# Patient Record
Sex: Male | Born: 1965 | Race: Black or African American | Hispanic: No | Smoking: Current every day smoker
Health system: Southern US, Community
[De-identification: ages and names within clinical notes are randomized; demographics above are authoritative.]

## PROBLEM LIST (undated history)

## (undated) DIAGNOSIS — I10 Essential (primary) hypertension: Secondary | ICD-10-CM

## (undated) DIAGNOSIS — R7301 Impaired fasting glucose: Secondary | ICD-10-CM

## (undated) DIAGNOSIS — I509 Heart failure, unspecified: Secondary | ICD-10-CM

## (undated) DIAGNOSIS — E7800 Pure hypercholesterolemia, unspecified: Secondary | ICD-10-CM

## (undated) HISTORY — DX: Essential (primary) hypertension: I10

## (undated) HISTORY — DX: Heart failure, unspecified: I50.9

## (undated) HISTORY — DX: Pure hypercholesterolemia, unspecified: E78.00

## (undated) HISTORY — DX: Impaired fasting glucose: R73.01

---

## 2009-12-11 ENCOUNTER — Emergency Department (HOSPITAL_COMMUNITY): Admission: EM | Admit: 2009-12-11 | Discharge: 2009-12-11 | Payer: Self-pay | Admitting: Family Medicine

## 2009-12-21 ENCOUNTER — Encounter: Admission: RE | Admit: 2009-12-21 | Discharge: 2009-12-21 | Payer: Self-pay | Admitting: Family Medicine

## 2010-04-30 ENCOUNTER — Inpatient Hospital Stay (HOSPITAL_COMMUNITY)
Admission: EM | Admit: 2010-04-30 | Discharge: 2010-05-03 | Payer: Self-pay | Source: Home / Self Care | Attending: Internal Medicine | Admitting: Internal Medicine

## 2010-08-07 LAB — COMPREHENSIVE METABOLIC PANEL
ALT: 21 U/L (ref 0–53)
AST: 25 U/L (ref 0–37)
AST: 25 U/L (ref 0–37)
Albumin: 3.2 g/dL — ABNORMAL LOW (ref 3.5–5.2)
Alkaline Phosphatase: 50 U/L (ref 39–117)
CO2: 26 mEq/L (ref 19–32)
Calcium: 8.4 mg/dL (ref 8.4–10.5)
Calcium: 8.8 mg/dL (ref 8.4–10.5)
Creatinine, Ser: 1.27 mg/dL (ref 0.4–1.5)
GFR calc Af Amer: >60 mL/min (ref >60)
GFR calc Af Amer: >60 mL/min (ref >60)
GFR calc non Af Amer: >60 mL/min (ref >60)
Glucose, Bld: 110 mg/dL — ABNORMAL HIGH (ref 70–99)
Glucose, Bld: 117 mg/dL — ABNORMAL HIGH (ref 70–99)
Potassium: 3.6 mEq/L (ref 3.5–5.1)
Sodium: 141 mEq/L (ref 135–145)
Total Protein: 6.7 g/dL (ref 6.0–8.3)

## 2010-08-07 LAB — TSH: TSH: 2.67 u[IU]/mL (ref 0.350–4.500)

## 2010-08-07 LAB — BRAIN NATRIURETIC PEPTIDE
Pro B Natriuretic peptide (BNP): 327 pg/mL — ABNORMAL HIGH (ref 0.0–100.0)
Pro B Natriuretic peptide (BNP): 94 pg/mL (ref 0.0–100.0)

## 2010-08-07 LAB — CBC
HCT: 43.2 % (ref 39.0–52.0)
Hemoglobin: 13.4 g/dL (ref 13.0–17.0)
MCH: 28.7 pg (ref 26.0–34.0)
MCHC: 32.6 g/dL (ref 30.0–36.0)
MCHC: 32.9 g/dL (ref 30.0–36.0)
Platelets: 149 10*3/uL — ABNORMAL LOW (ref 150–400)
RDW: 14.6 % (ref 11.5–15.5)
WBC: 4.8 10*3/uL (ref 4.0–10.5)

## 2010-08-07 LAB — DIFFERENTIAL
Lymphocytes Relative: 32 % (ref 12–46)
Lymphs Abs: 2 10*3/uL (ref 0.7–4.0)
Neutro Abs: 3.4 10*3/uL (ref 1.7–7.7)
Neutrophils Relative %: 56 % (ref 43–77)

## 2010-08-07 LAB — URINALYSIS, ROUTINE W REFLEX MICROSCOPIC
Bilirubin Urine: NEGATIVE
Hgb urine dipstick: NEGATIVE
Ketones, ur: NEGATIVE mg/dL
Nitrite: NEGATIVE
Urobilinogen, UA: 0.2 mg/dL (ref 0.0–1.0)

## 2010-08-07 LAB — LIPID PANEL
HDL: 35 mg/dL — ABNORMAL LOW (ref >39)
Total CHOL/HDL Ratio: 4.9 RATIO
VLDL: 25 mg/dL (ref 0–40)

## 2010-08-07 LAB — POCT CARDIAC MARKERS
CKMB, poc: 1.9 ng/mL (ref 1.0–8.0)
Myoglobin, poc: 89.3 ng/mL (ref 12–200)
Troponin i, poc: <0.05 ng/mL (ref 0.00–0.09)

## 2010-08-07 LAB — CK: Total CK: 330 U/L — ABNORMAL HIGH (ref 7–232)

## 2010-08-07 LAB — BASIC METABOLIC PANEL
Calcium: 8.8 mg/dL (ref 8.4–10.5)
GFR calc Af Amer: >60 mL/min (ref >60)
GFR calc non Af Amer: >60 mL/min (ref >60)
Potassium: 4.1 mEq/L (ref 3.5–5.1)
Sodium: 140 mEq/L (ref 135–145)

## 2010-08-07 LAB — CARDIAC PANEL(CRET KIN+CKTOT+MB+TROPI)
CK, MB: 3.2 ng/mL (ref 0.3–4.0)
Relative Index: 1.3 (ref 0.0–2.5)
Troponin I: 0.12 ng/mL — ABNORMAL HIGH (ref 0.00–0.06)
Troponin I: 0.13 ng/mL — ABNORMAL HIGH (ref 0.00–0.06)

## 2010-08-07 LAB — RAPID URINE DRUG SCREEN, HOSP PERFORMED
Amphetamines: NOT DETECTED
Cocaine: NOT DETECTED
Tetrahydrocannabinol: NOT DETECTED

## 2010-08-07 LAB — MRSA PCR SCREENING: MRSA by PCR: POSITIVE — AB

## 2010-08-07 LAB — HEMOGLOBIN A1C: Hgb A1c MFr Bld: 6.3 % — ABNORMAL HIGH (ref <5.7)

## 2010-08-07 LAB — CK TOTAL AND CKMB (NOT AT ARMC): Relative Index: 1.2 (ref 0.0–2.5)

## 2010-08-07 LAB — TROPONIN I: Troponin I: 0.13 ng/mL — ABNORMAL HIGH (ref 0.00–0.06)

## 2010-12-05 ENCOUNTER — Inpatient Hospital Stay (INDEPENDENT_AMBULATORY_CARE_PROVIDER_SITE_OTHER)
Admission: RE | Admit: 2010-12-05 | Discharge: 2010-12-05 | Disposition: A | Discharge status: Home / Self Care | Payer: 59 | Source: Ambulatory Visit | Attending: Family Medicine | Admitting: Family Medicine

## 2010-12-05 DIAGNOSIS — M79609 Pain in unspecified limb: Secondary | ICD-10-CM

## 2010-12-05 DIAGNOSIS — A64 Unspecified sexually transmitted disease: Secondary | ICD-10-CM

## 2011-11-27 ENCOUNTER — Inpatient Hospital Stay: Payer: Self-pay | Admitting: Internal Medicine

## 2011-11-27 LAB — COMPREHENSIVE METABOLIC PANEL
Albumin: 3.5 g/dL (ref 3.4–5.0)
Alkaline Phosphatase: 77 U/L (ref 50–136)
Anion Gap: 5 — ABNORMAL LOW (ref 7–16)
BUN: 20 mg/dL — ABNORMAL HIGH (ref 7–18)
Bilirubin,Total: 0.5 mg/dL (ref 0.2–1.0)
Glucose: 105 mg/dL — ABNORMAL HIGH (ref 65–99)
Osmolality: 282 (ref 275–301)
Potassium: 3.7 mmol/L (ref 3.5–5.1)
SGOT(AST): 38 U/L — ABNORMAL HIGH (ref 15–37)
Sodium: 140 mmol/L (ref 136–145)

## 2011-11-27 LAB — CBC
MCH: 30 pg (ref 26.0–34.0)
MCHC: 32.3 g/dL (ref 32.0–36.0)
Platelet: 125 10*3/uL — ABNORMAL LOW (ref 150–440)
RDW: 16.2 % — ABNORMAL HIGH (ref 11.5–14.5)

## 2011-11-27 LAB — TROPONIN I
Troponin-I: 1.31 ng/mL — ABNORMAL HIGH
Troponin-I: 1.26 ng/mL — ABNORMAL HIGH
Troponin-I: 1.11 ng/mL — ABNORMAL HIGH

## 2011-11-27 LAB — APTT
Activated PTT: 28.3 secs (ref 23.6–35.9)
Activated PTT: 149.3 secs — ABNORMAL HIGH (ref 23.6–35.9)

## 2011-11-27 LAB — URINALYSIS, COMPLETE
Bacteria: NONE SEEN
Ketone: NEGATIVE
Protein: NEGATIVE
Specific Gravity: 1.004 (ref 1.003–1.030)
Squamous Epithelial: <1
WBC UR: <1 /HPF (ref 0–5)

## 2011-11-28 LAB — APTT: Activated PTT: >160 secs (ref 23.6–35.9)

## 2012-07-12 IMAGING — CR DG CHEST 2V
2 series · 2 of 2 positions shown · non-contrast
Comparison: None.

CLINICAL DATA: Shortness of breath.  Bilateral leg edema.

CHEST - 2 VIEW

[w chest pa]
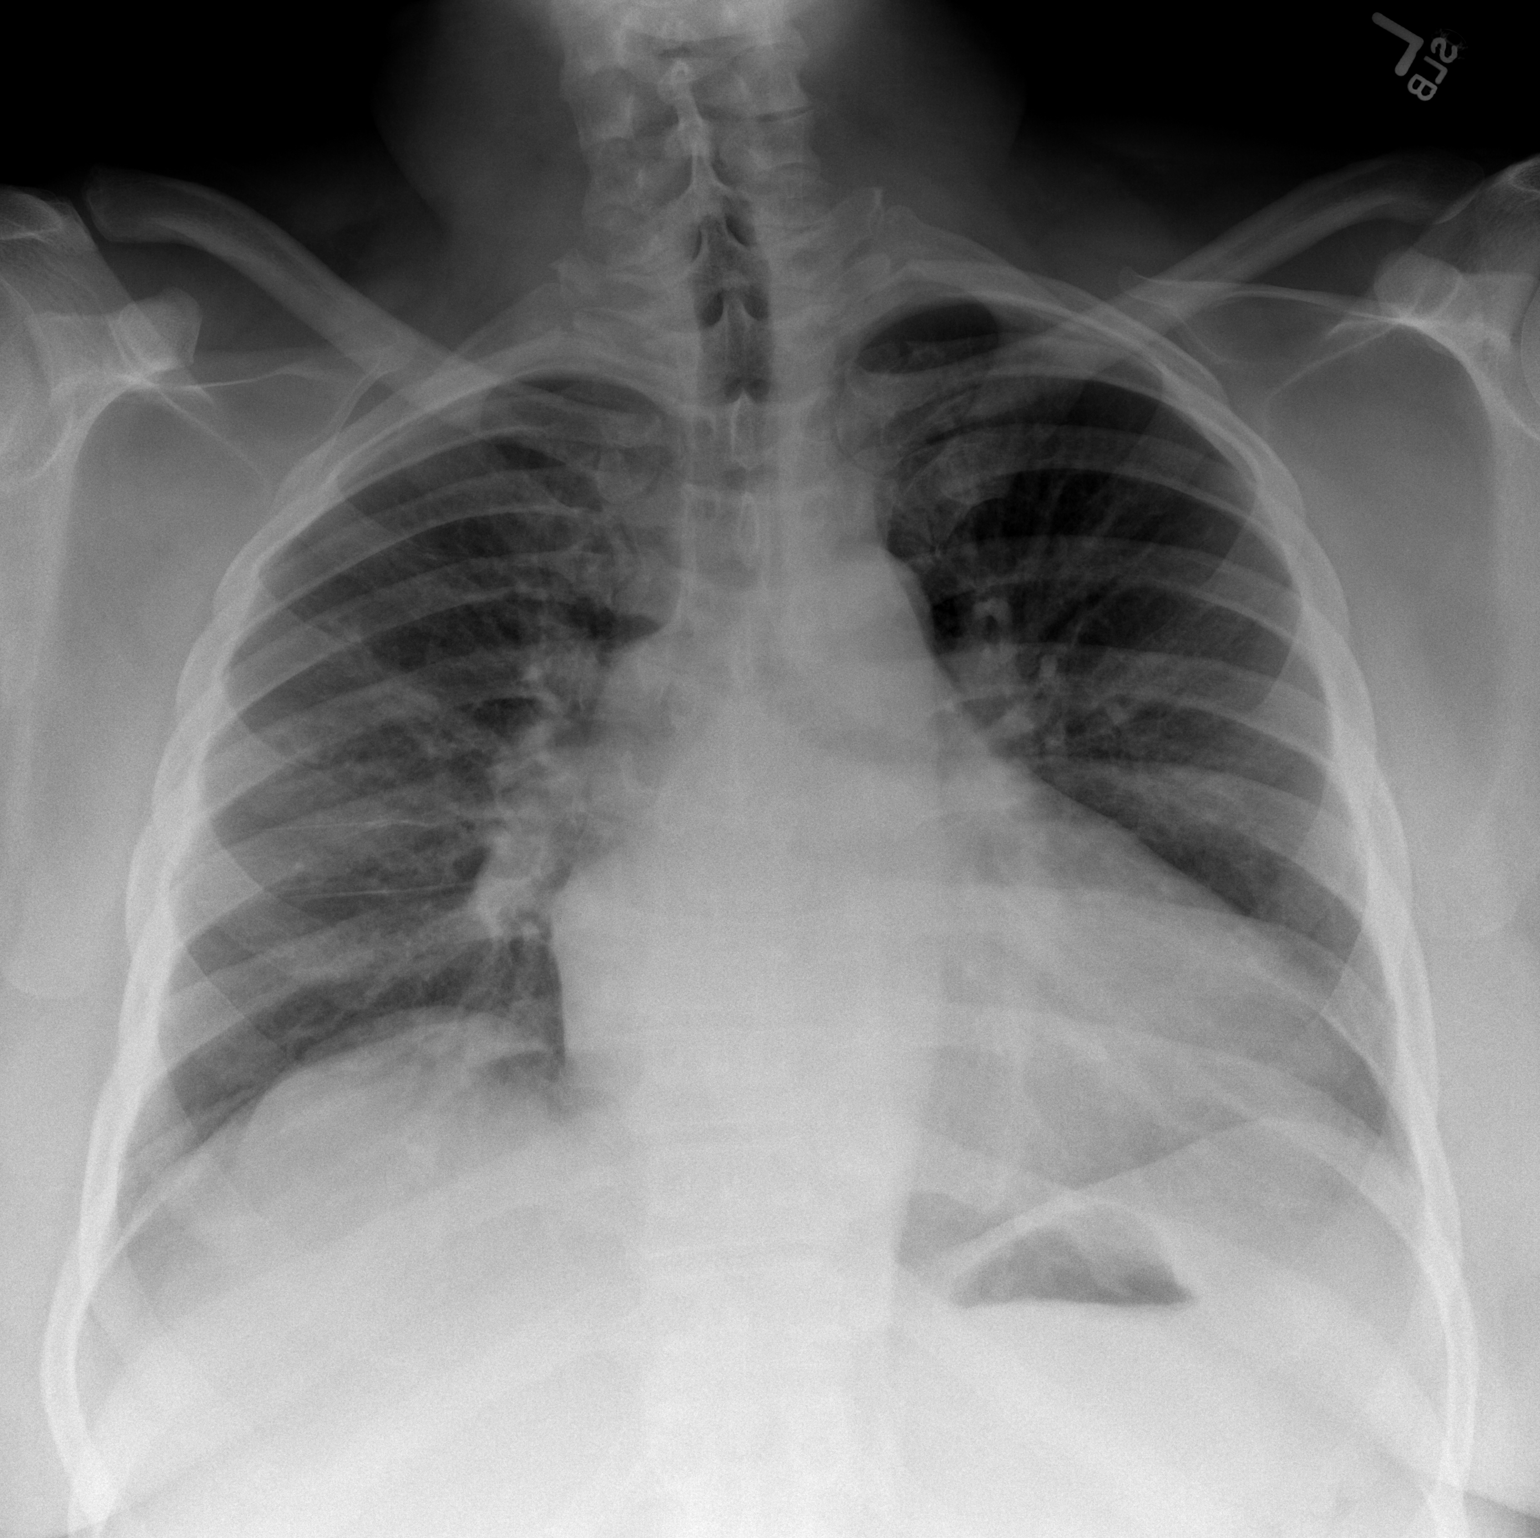

[w chest lat *]
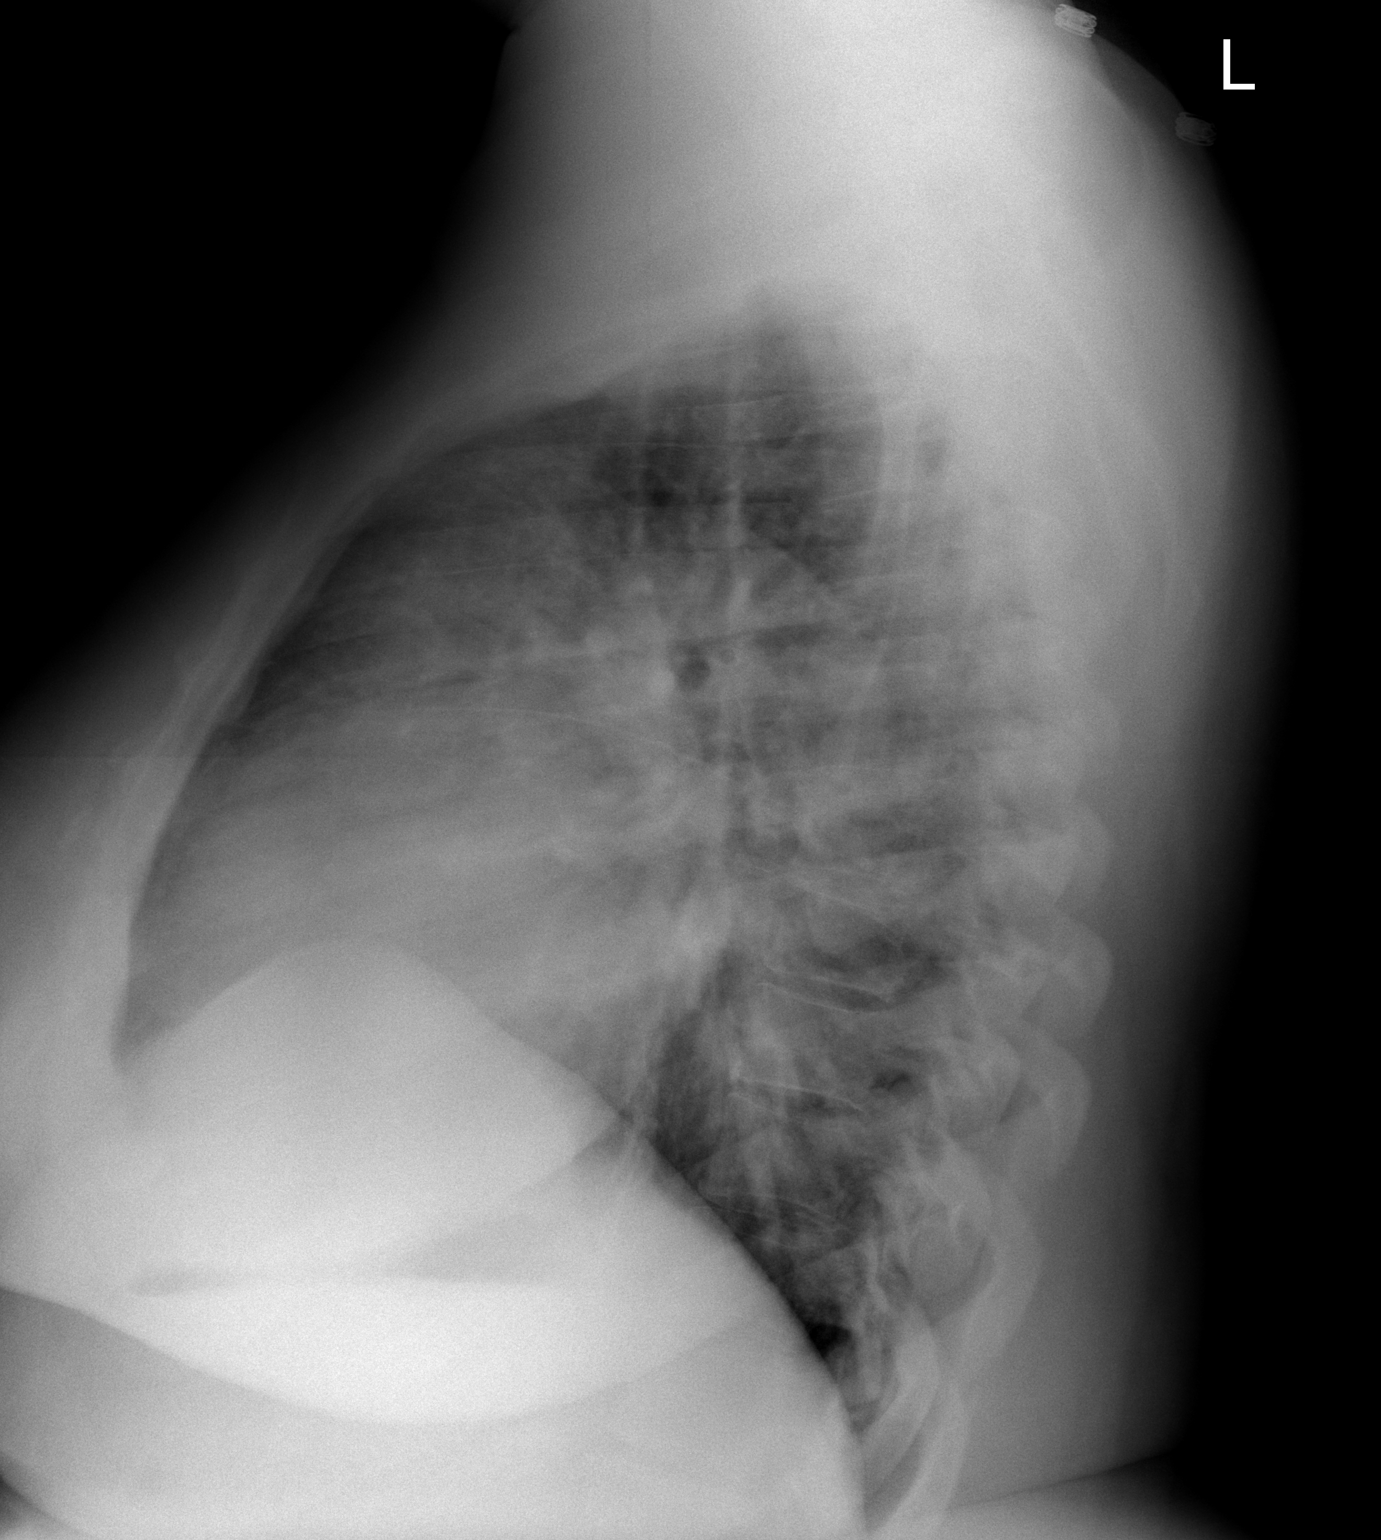

[2 of 2 positions shown; findings below may reference images not displayed]

FINDINGS: Mild cardiomegaly noted.

Low lung volumes are present, causing crowding of the pulmonary
vasculature.

On the lateral projection, there is density projecting over the
anterior mediastinum which could possibly represent a mediastinal
mass.  Accordingly, CT of chest with contrast is recommended.

No pleural effusion identified.
IMPRESSION: 1.  Greater than expected density projecting over the anterior
mediastinum on the lateral projection.  I cannot confidently
exclude an anterior mediastinal mass, and accordingly, CT the chest
with contrast is recommended.
2.  Mild cardiomegaly.
3.  Low lung volumes.

## 2012-07-13 IMAGING — CT CT CHEST W/ CM
2 of 4 series · 15 of 36 positions shown, 18 images · IV contrast (APPLIED)
Comparison: Chest radiograph dated 04/30/2010

CLINICAL DATA: Chest pain.  Congestive heart failure.  Leg
swelling.  Dyspnea.

CT CHEST WITH CONTRAST
TECHNIQUE: Multidetector CT imaging of the chest was performed
following the standard protocol during bolus administration of
intravenous contrast.
Contrast: 100 ml Bmnipaque-CUU

[Series 2: routine chest 5.0 st · axial · 0.85mm/px · z∈[+986,+1270]mm · 12 of 67 slices shown, 15 images]
[im 5/67  mediastinal]
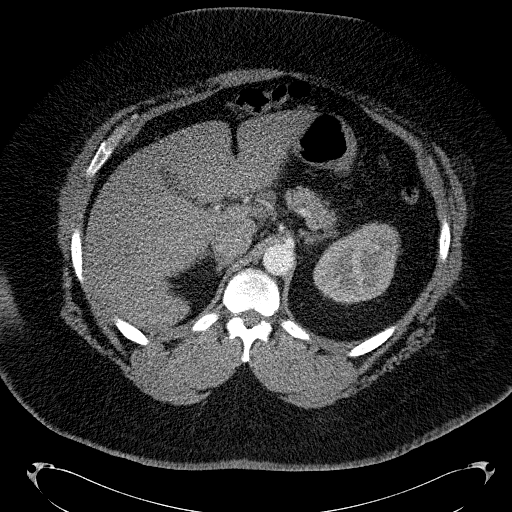
[im 5/67  lung]
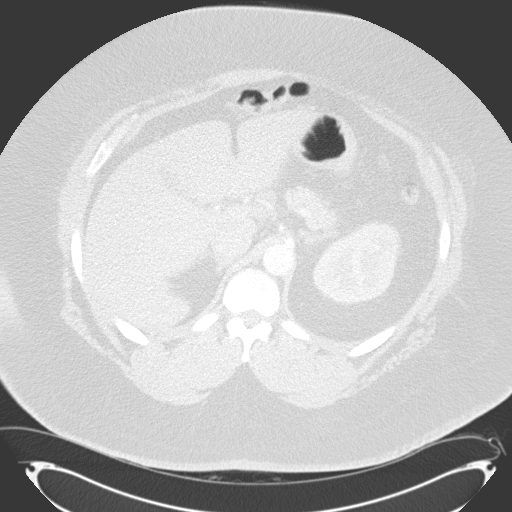
[im 10/67  lung]
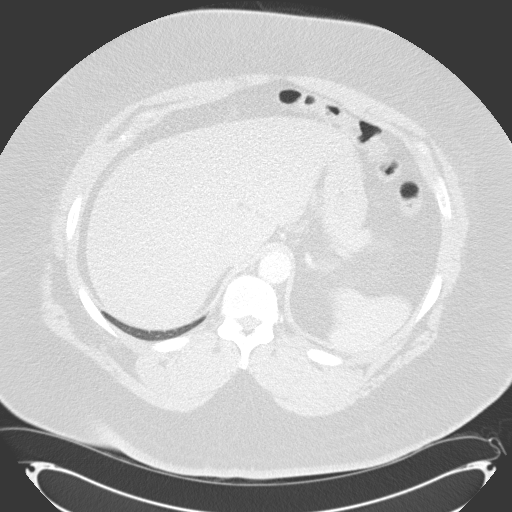
[im 15/67  lung]
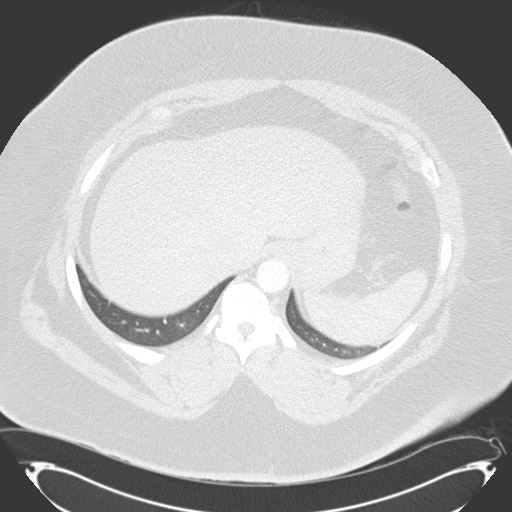
[im 19/67  lung]
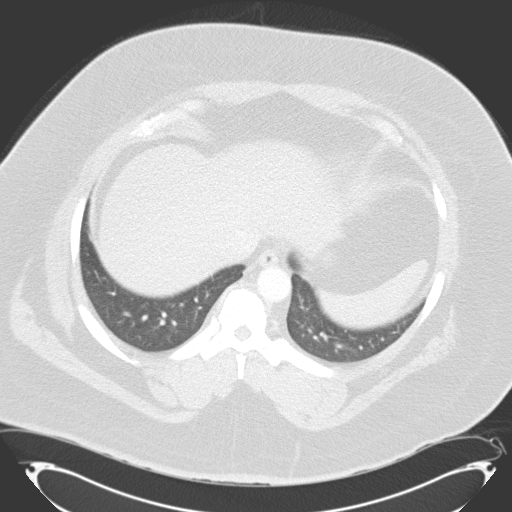
[im 24/67  mediastinal]
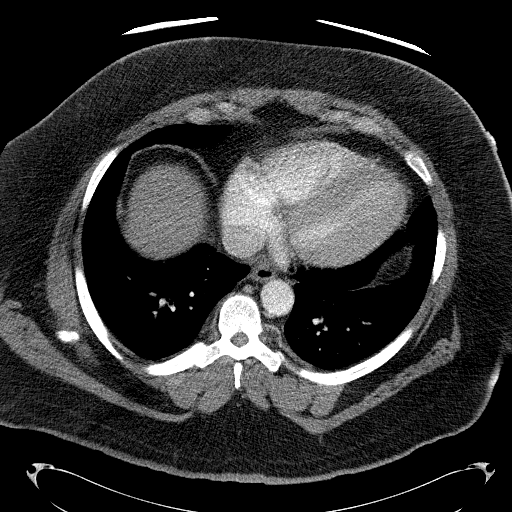
[im 24/67  lung]
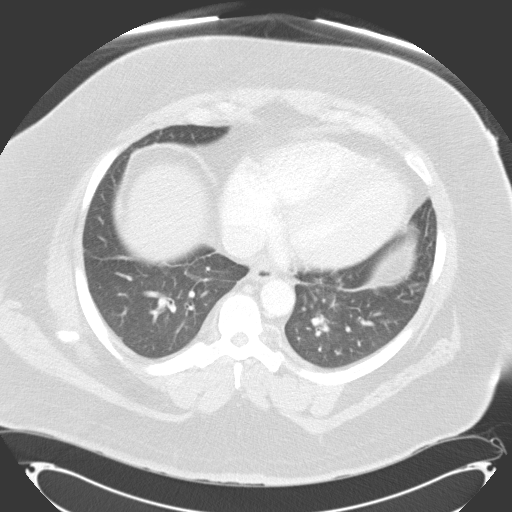
[im 29/67  lung]
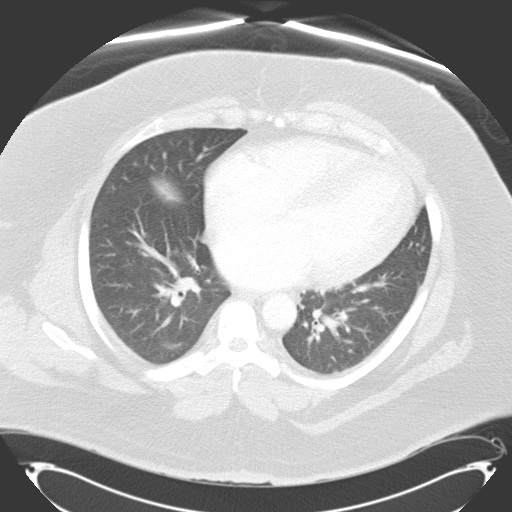
[im 38/67  lung]
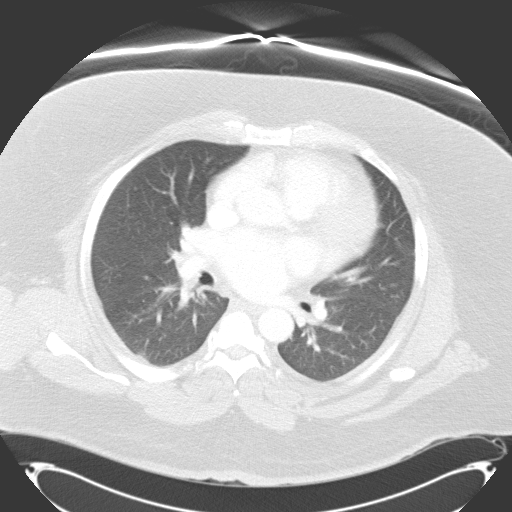
[im 43/67  lung]
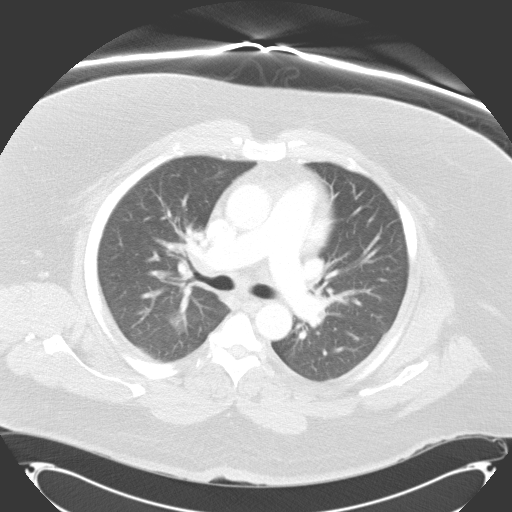
[im 48/67  mediastinal]
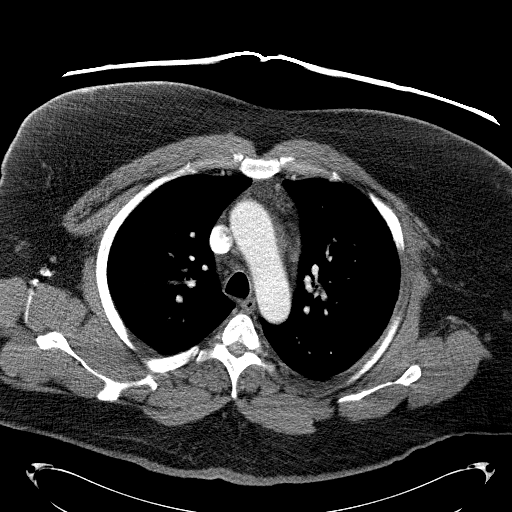
[im 48/67  lung]
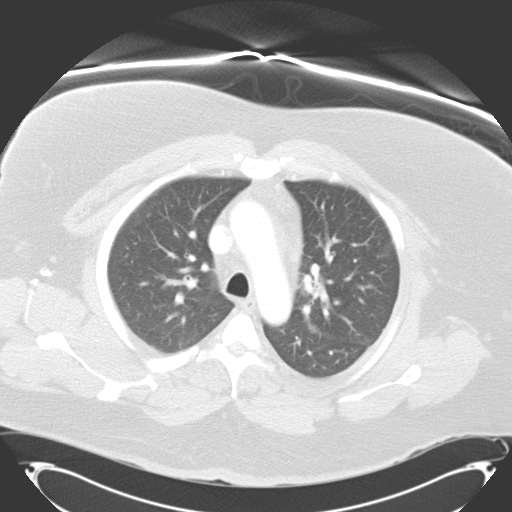
[im 52/67  lung]
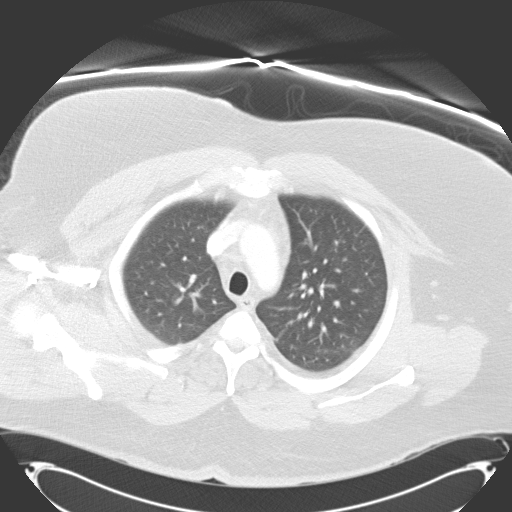
[im 57/67  lung]
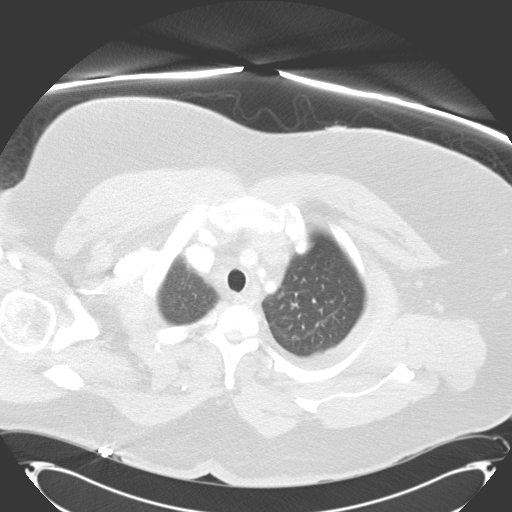
[im 62/67  lung]
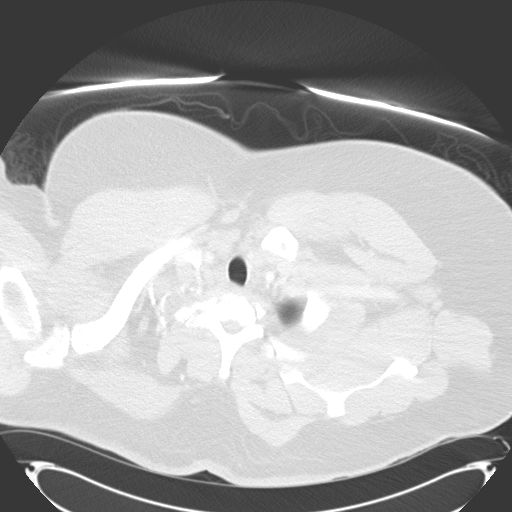

[Series 5: routine chest 2.0 st · coronal · 0.77mm/px · 3 of 126 slices shown]
[im 26/126  lung]
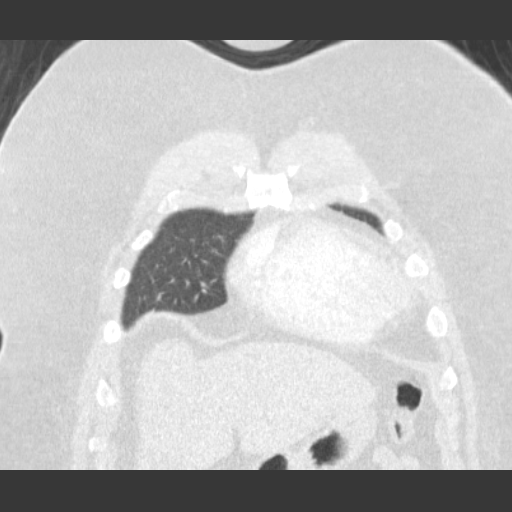
[im 51/126  lung]
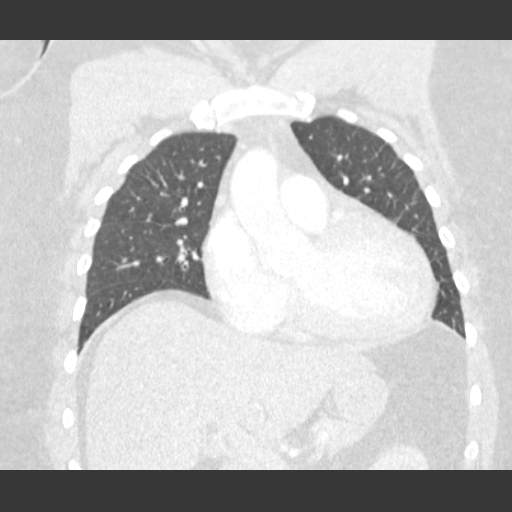
[im 76/126  lung]
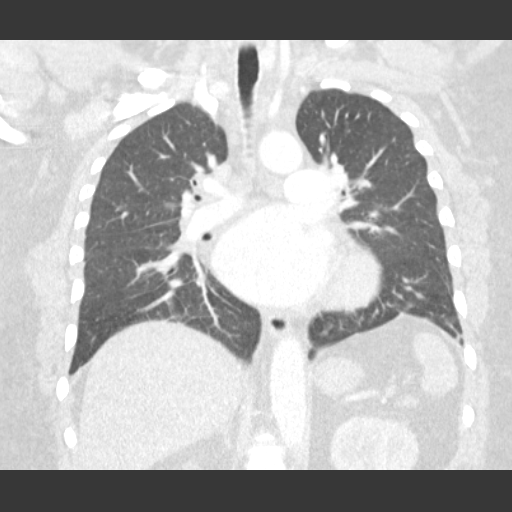

[15 of 36 positions shown; findings below may reference images not displayed]

FINDINGS: Scattered small mediastinal lymph nodes are present but
no mediastinal mass is evident to correlate with the finding on
chest radiography.  The appearance on chest radiography may have
and due to soft tissues of the chest wall.  No pathologic
adenopathy in the chest is identified.

Mild cardiomegaly is present.  Although today's exam was not
performed as a CT angiogram, no discrete vascular abnormality is
identified.

A hypodense 2.5 cm exophytic structure from the left kidney upper
pole is low density and likely represents a simple cyst.

Mild airway thickening is compatible with bronchitis or reactive
airways disease.
IMPRESSION: 1.  No mediastinal mass or pathologic mediastinal adenopathy
identified.  The finding on conventional radiography was likely due
to soft tissues of the chest wall mimicking an anterior mediastinal
process.
2.  Mild cardiomegaly.
3.  Left kidney upper pole cyst.
4.  Mild airway thickening suggest bronchitis or reactive airways
disease.

## 2013-12-25 ENCOUNTER — Encounter: Payer: Self-pay | Admitting: *Deleted

## 2014-02-08 IMAGING — CR DG CHEST 2V
1 series · 2 of 2 positions shown · non-contrast
Comparison: none

REASON FOR EXAM: sob
COMMENTS:   May transport without cardiac monitor

[Series 1: w chest pa · 0.14mm/px · 2 of 2 slices shown]
[im 1/2]
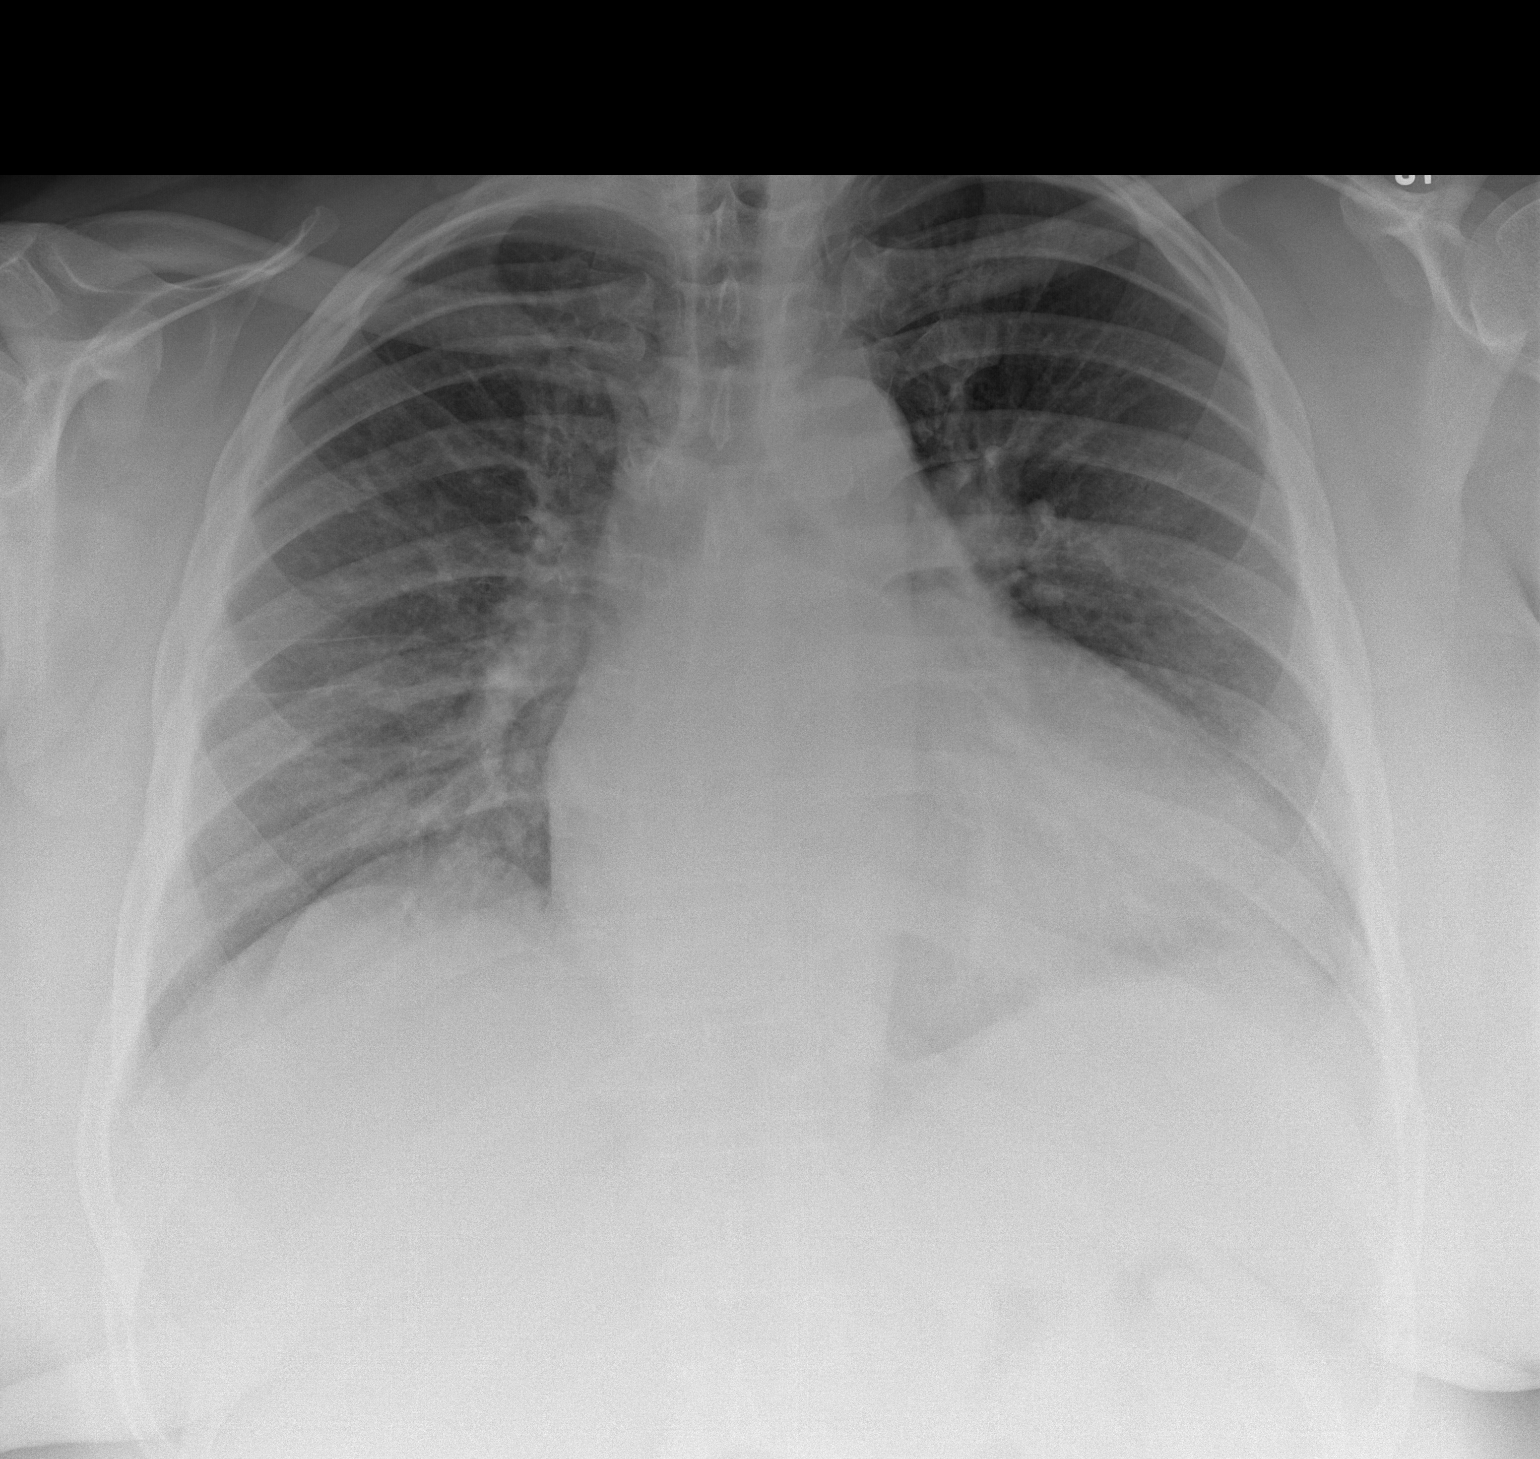
[im 2/2]
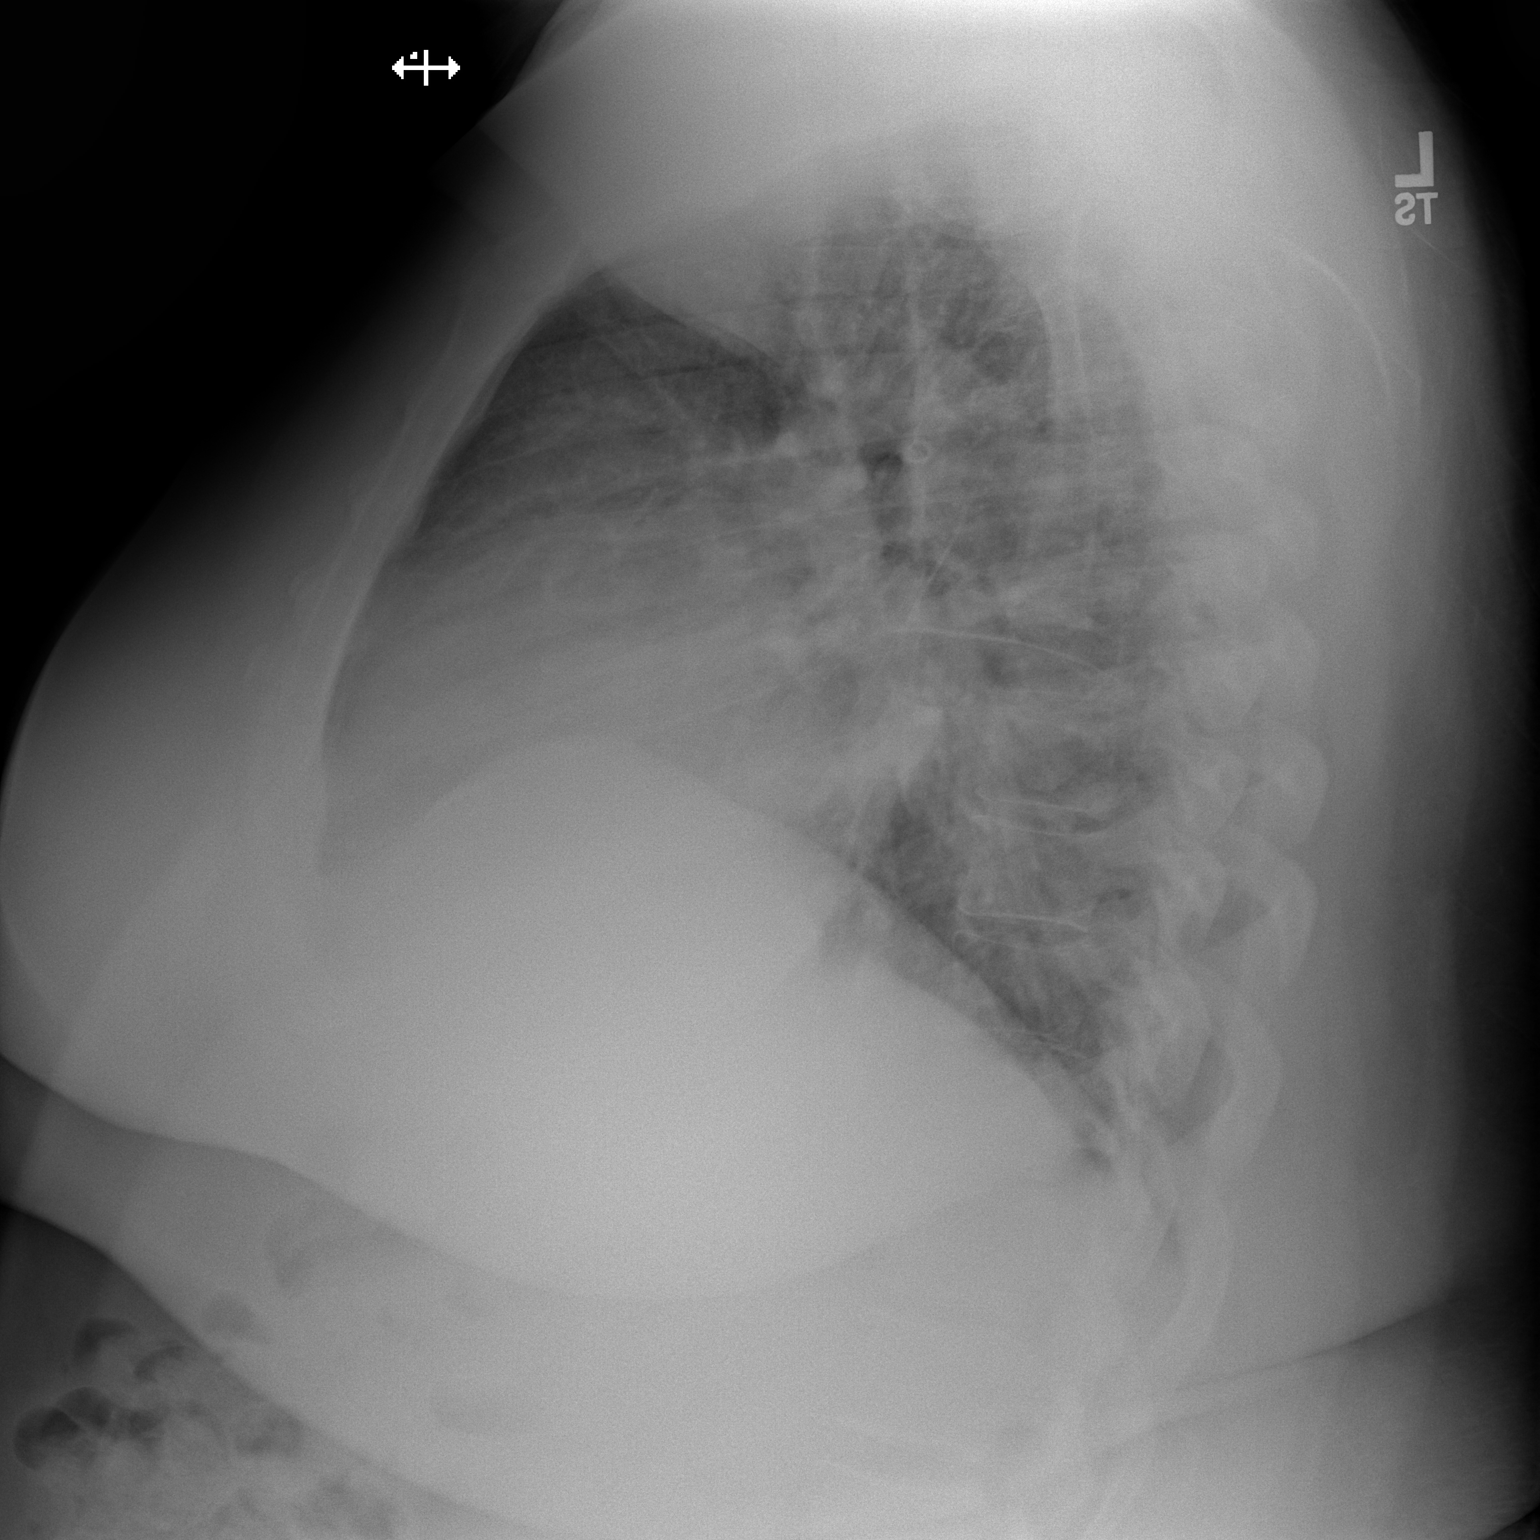

[2 of 2 positions shown; findings below may reference images not displayed]

PROCEDURE:     DXR - DXR CHEST PA (OR AP) AND LATERAL  - November 27, 2011  [DATE]

RESULT:     The lungs are reasonably well inflated. The interstitial
markings are mildly increased. The cardiac silhouette is enlarged. The
pulmonary vascularity is mildly engorged. There is no pleural effusion. The
mediastinum is normal in width. The bony thorax exhibits no acute
abnormality.
IMPRESSION: The findings are consistent with low-grade CHF with mild
pulmonary interstitial edema.

## 2014-09-18 NOTE — Consult Note (Signed)
PATIENT NAME:  Gilbert Kirk, [name]:  [id number] DATE OF BIRTH:  December 01, 1965  DATE OF CONSULTATION:  11/28/2011  REFERRING PHYSICIAN:  Dr. Rudene Rearwish with [facility]. Patient also sees Dr.  in [place] and [place] CONSULTING PHYSICIAN:  Dwayne D. Callwood, MD  INDICATION: Shortness of breath, possible heart failure.   HISTORY OF PRESENT ILLNESS: Mr. Gilbert Kirk is a [age] year old African American male with history of congestive heart failure for the last five years or so. The patient [facility] [place] [facility] [place] but over the last few months he is admitted [facility], had an echocardiogram, treated medically and discharged. Patient admits he did not follow his diet and has not been taking his medicines appropriately. He is not quite sure about the details of his illness but states that he has gotten short of breath over the last 2 to 3 days with leg edema. He came in for further evaluation. He has had congestive heart failure. Troponin was elevated. Denied significant chest pain, angina.  REVIEW OF SYSTEMS: No blackout spells, syncope. No nausea. No vomiting. Denies fever, chills, sweats. No weight loss. No weight gain. He has had mild weight gain and swelling. No hemoptysis, hematemesis. Denies bright red blood per rectum. No vision change or hearing change. No sputum production or cough.   PAST MEDICAL HISTORY:  1. Congestive heart failure. 2. Hypertension. 3. Morbid obesity.   SOCIAL HISTORY: Married. Unemployed. Smoker. Denies significant alcohol consumption.   FAMILY HISTORY: Heart disease.   MEDICATIONS:  1. Coreg 25 twice a day.  2. Accupril 20 a day.  3. Lasix 40 twice a day.  4. Aldactone 25 twice a day. 5. Potassium 8 mEq twice a day.  6. Aspirin 81 mg a day.  7. Amlodipine 5 a day.   ALLERGIES: None.   PHYSICAL EXAMINATION:  VITAL SIGNS: Blood pressure 180/130, pulse 72, respiratory rate 20, afebrile.   HEENT: Normocephalic, atraumatic. Pupils reactive to light.   NECK:  Supple. Mild jugular venous distention. No bruits, adenopathy.   LUNGS: Bilateral rhonchi. Mild rales in the bases. Positive rhonchi. No dullness. Adequate air movement.   HEART: Regular rate and rhythm, distant. Positive S3. Soft S4. PMI displaced laterally.   ABDOMEN: Benign.   EXTREMITIES: Normal.   NEUROLOGICAL: Intact.   SKIN: Normal.   LABORATORY, DIAGNOSTIC AND RADIOLOGICAL DATA: Chest x-ray: Cardiomegaly, congestive heart failure. EKG: Sinus rhythm, nonspecific ST-T wave changes. BNP 1700, glucose 105, BUN 20, creatinine 1.5, sodium 140, potassium 3.7. LFTs basically negative. Troponin 1.3, white count 4.6, hemoglobin 14, hematocrit 43, platelet count 125. Urinalysis unremarkable.   ASSESSMENT:  1. Shortness of breath. 2. Congestive heart failure.  3. Malignant hypertension.  4. Morbid obesity.  5. Noncompliance. 6. Edema. 7. Smoking.  8. Renal insufficiency.   PLAN: Continue current medications. Reinstitute Lasix therapy. Reinstitute heart failure therapy. Recommend patient follow diet. Recommend weight loss and exercise. Advised the patient to quit smoking. Have the patient follow up with his primary cardiologist in [place] and [place].   ____________________________ Bobbie Stackwayne D. Juliann Paresallwood, MD ddc:cms D: 12/02/2011 09:14:00 ET T: 12/02/2011 11:56:17 ET JOB#: [id number]  cc: Dwayne D. Juliann Paresallwood, MD, <Dictator> Alwyn PeaWAYNE D CALLWOOD MD ELECTRONICALLY SIGNED 12/17/2011 9:15

## 2014-09-18 NOTE — Discharge Summary (Signed)
PATIENT NAME:  Gilbert Kirk, CUBBAGE MR#:  [id number] DATE OF BIRTH:  [birth year]  DATE OF ADMISSION:  11/27/2011 DATE OF DISCHARGE:  11/28/2011  DISCHARGE DIAGNOSES: 1. Acute on chronic systolic  heart failure.  2. Malignant hypertension.  3. Morbid obesity.  4. Chronic renal insufficiency.   DISCHARGE MEDICATIONS: 1. Lasix 40 mg p.o. b.i.d.  2. Accupril 20 mg 2 tablets once a day. 3. Aldactone 25 mg p.o. b.i.d.  4. KCl 8 mEq p.o. 2 tablets once a day. 5. Norvasc 5 mg p.o. daily.  6. Aspirin 81 mg daily. 7. Coreg 25 mg p.o. b.i.d.  NEW MEDICINE: Hydralazine 25 mg p.o. 4 times a day.   DIET: Low sodium diet, and check your outpatient weights daily.   FOLLOWUP: Follow-up with primary cardiologist in [place]. The name of the practice [facility]. The patient agrees to be compliant with the medications, low sodium diet, checking daily weights. Also needs repeat renal function probably every two weeks to make sure they are stable and also check the blood pressure at home with ambulatory BP machine.   CONSULTATION: Cardiology consult with Dr. Lady Gary.   HOSPITAL COURSE: A 49 year old male with history of hypertension, history of cardiomyopathy with systolic congestive heart failure. Came with pedal edema. sob and chest tightness. The patient had been to hospital, [facility], and also [place], and [facility]. The patient has noncompliance issues and also does not take medicines regularly. Also he eats lots of salt, high-salt diet.  The patient says that the day before, when he started to have symptoms he ate high  salt fired foods .i nER and the patient was given IV Lasix. The patient admitted for congestive heart failure exacerbation. This is a 49 year old admitted for trouble breathing with chest tightness as I mentioned.  On admission the patient blood pressure was 180/130. His creatinine 1.5. Troponin 1.3. EKG nonspecific ST-T wave changes. BNP 1706. Patient's chest x-ray showed  pulmonary vascular congestion, so the patient was admitted to the hospitalist service and started on Coreg, lisinopril and Lasix every 6 hours IV.  Patient actually got much better, and today he feels better. Pedal edema still there but does not have any  sob,pitting  edema still  present and feels like baseline. He had negative balance of up to 1 liter yesterday. Echocardiogram showed ejection fraction of 20% with left ventricular systolic function moderately to severely reduced. EF as I mentioned, and no wall motion abnormality and has left ventricular hypertrophy. The patient seen by cardiology, Dr. Lady Gary. Initial troponins were 1.31, and the next troponin was 1.26. The patient feels better. EKG: No ST-T changes. Because of troponin elevation, he was started on heparin, but he says that he bruises easily and denies any chest pain, so I stopped the heparin, and patient can continue only aspirin 81 mg daily, and patient needs to check his daily weights.  Dietary counseling was done, and Dr. Lady Gary recommended no need of cardiac catheterization at this time, which I agree with. The patient had no chest pain. No EKG changes, and he wants to go home today.  The patient's other diagnoses include morbid obesity. Counseled again for weight reduction and compliant  with the diet. The patient already has refills for Lasix, lisinopril, Aldactone and Coreg. I will start hydralazine  due to his  bp  this morning 160/106.. The patient to continue hydralazine and follow up with his cardiologist in [place].   Mild chronic renal insufficiency, probably secondary to combination of Lasix, lisinopril,  and Aldactone. The patient advised to follow up with the cardiologist on routine basis to make sure his kidney function is stable. ,need frequnt blood work every 3 months  TIME:  Time spent on discharge preparation more than 30 minutes.     ____________________________ Katha HammingSnehalatha Makinsey Pepitone, MD sk:kma D: 11/28/2011 12:24:34  ET T: 11/29/2011 12:37:01 ET JOB#: [id number]  cc: Katha HammingSnehalatha Haylea Schlichting, MD, <Dictator> Katha HammingSNEHALATHA Kalla Watson MD ELECTRONICALLY SIGNED 12/03/2011 10:04

## 2014-09-18 NOTE — H&P (Signed)
PATIENT NAME:  Gilbert Kirk, Gilbert Kirk MR#:  [id number] DATE OF BIRTH:  09/20/1965  DATE OF ADMISSION:  11/27/2011  PRIMARY CARE PHYSICIAN: Chaya JanP. Mund, [place] in [place], [place].   CHIEF COMPLAINT: Increased shortness of breath, getting worse over the last two days.   HISTORY OF PRESENT ILLNESS: Mr. Gilbert Kirk is a 49 year old African American male with history of congestive heart failure for the last 4 to 5 years. The patient usually goes [place] [facility] [place], and three months ago he was admitted [facility], had an echocardiogram and was discharged on medications and on a specific diet. The patient admits that he did not follow the diet, nor is he taking his medications as he is supposed to. He does not know details about his illness or results of his last echo. He progressively got short of breath over the last two days associated with legs edema. The patient came here seeking medical advice and treatment. He was found to be in congestive state and his troponin is elevated. The patient was admitted for further evaluation and treatment. He denies having any chest pain recently. It is unknown to us whether he has chronic elevation of troponin or not.    REVIEW OF SYSTEMS: CONSTITUTIONAL: Denies having any fever. No chills. No fatigue. No night sweats. EYES: No blurring of vision. No double vision. ENT: No hearing impairment. No sore throat. No dysphagia. CARDIOVASCULAR: No chest pain but reports shortness of breath and edema. No syncope. RESPIRATORY: Reports shortness of breath. No cough. No chest pain. No hemoptysis. GASTROINTESTINAL: No abdominal pain. No nausea, no vomiting, and no diarrhea. GENITOURINARY: No dysuria. No frequency of urination. MUSCULOSKELETAL: No joint pain or swelling. No muscular pain or swelling. INTEGUMENTARY: No skin rash. No ulcers. NEUROLOGY: No focal weakness. No seizure activity. No headache. PSYCHIATRY: No anxiety. No depression. ENDOCRINE: No polyuria or polydipsia. No heat or cold  intolerance.   PAST MEDICAL HISTORY:  1. History of congestive cardiomyopathy. 2. History of severe systemic hypertension.  3. Morbid obesity.   SOCIAL HABITS: Chronic smoker. He smokes on and off. He quit about seven years, and then a year ago he is back smoking. Then he stopped smoking again a month ago. He drinks alcohol only socially. He does not abuse drugs, never abused drugs.   SOCIAL HISTORY: He is married. He is unemployed.   FAMILY HISTORY: Negative for premature coronary artery disease, reporting a grandmother and an aunt, both deceased, and they suffered from some kind of heart problem that he does not know details.   ADMISSION MEDICATIONS:  1. Coreg 25 mg b.i.d.  2. Accupril 20 mg a day. 3. Lasix 40 mg b.i.d.   4. Aldactone 25 mg b.i.d.  5. Potassium chloride 8 mEq b.i.d.   6. Aspirin 81 mg a day.  7. Amlodipine 5 mg a day.   ALLERGIES: No known drug allergies.   PHYSICAL EXAMINATION:  VITAL SIGNS: Blood pressure 180/130, respiratory rate 20, pulse 82, temperature 98.4, oxygen saturation 97% on oxygen.   GENERAL APPEARANCE: A young male, morbidly obese, lying down in no acute distress.   HEENT: No pallor. No icterus. No cyanosis. Ears, nose and throat: Hearing was normal. Nasal mucosa, lips, tongue were normal. Eyes: Examination revealed normal iris and conjunctivae. Pupils about 6 mm, equal and reactive to light.   NECK: Supple. Trachea at midline. No thyromegaly. No cervical lymphadenopathy. No masses.   HEART: Exam revealed distant heart sounds. No murmur was appreciated. Faint S1 and S2. No S3 or  S4. No carotid bruits.   RESPIRATORY: Slight tachypnea with respiratory rate 20 to 22. He is not using accessory muscles. I could not hear rales, but the patient has decreased breathing sounds bilaterally due to his morbid obesity. No wheezing.  ABDOMEN: Morbidly obese, soft without tenderness. No hepatosplenomegaly. No masses. No hernias.   SKIN: No ulcers. No  subcutaneous nodules.   MUSCULOSKELETAL: No joint swelling. No clubbing.   NEUROLOGICAL: Cranial nerves II through XII were intact. No focal motor deficit.   PSYCHIATRIC: The patient is alert and oriented x3. Mood and affect were normal.   LABORATORY, DIAGNOSTIC AND RADIOLOGICAL DATA:  Chest x-ray revealed significant cardiomegaly associated with pulmonary vascular congestion. EKG showed normal sinus rhythm at a rate of 79 per minute, nonspecific ST-T wave abnormalities.  Blood work-up revealed B-type natriuretic peptide was 1706, glucose 105, BUN 20, creatinine 1.5, sodium 140, potassium 3.7.  Liver function tests were normal except for slight elevation of AST at 38.  Troponin elevated at 1.3.  CBC showed white count of 4,600, hemoglobin 14, hematocrit 43, platelet count 125.  Urinalysis was unremarkable.   ASSESSMENT:  1. Shortness of breath secondary to decompensated congestive heart failure, acute on chronic. We do not have information about his systolic function or ejection fraction, but he had an echocardiogram done three months ago at [facility]. We will try to obtain the echo results.  2. Severe systemic hypertension, uncontrolled.  3. Morbid obesity.  4. Noncompliance with medications and noncompliance with diet and instructions.   PLAN: We will admit the patient to telemetry. Follow-up on the cardiac enzymes. Intravenous Lasix 40 mg every 6 hours x4 doses, then he will stay on his oral Lasix. Continue beta blocker using Coreg and ACE inhibitor using Accupril. I will continue the aspirin but increase the dose from 81 mg to 325 mg.  IV heparin to follow protocol until we get his records from [place]. If he has chronic elevation of troponin and the level is where it was, then the approach will be different. If his baseline troponin is low, then we need to evaluate what kind of cardiac work-up he had at [facility]. Subsequently, he may need a nuclear stress test versus cardiac  catheterization preferably.  I also noted mild thrombocytopenia. This needs to be followed up as an outpatient. His creatinine is also elevated at 1.5, and we need to know his baseline. I ordered his records from last admission three months ago at [facility] to be faxed here, including his discharge summary and his echocardiogram. The patient does tell me that he had an echo done there. The patient needs to be more serious about his health, and to follow the advice, and to take his medications every day.   TIME SPENT:   Time spent in evaluating this patient took more than 55 minutes.    ____________________________ Carney Corners. Rudene Re, MD amd:cbb D: 11/27/2011 06:30:57 ET T: 11/27/2011 08:41:28 ET JOB#: [id number]  cc: Carney Corners. Rudene Re, MD, <Dictator> Chaya Jan, MD in [place], [place] Carney Corners Azariel Banik MD ELECTRONICALLY SIGNED 11/30/2011 22:13

## 2014-09-18 NOTE — Consult Note (Signed)
General Aspect pt with history of chf, cardiomyopathy and hypertension who was admitted after presenting to the er with complaints of edema, shortness of breath and chest tightness.  [place] and more [place] [facility].  He admits to noncompliance with all of his meds for several weeks to a month. He has improved with resumption of his medications.  He states he eats a high salt diet.  He has a mild troponin elevation.  Currently has no chest pain.   Physical Exam:   GEN obese    HEENT PERRL, hearing intact to voice    NECK No masses    RESP no use of accessory muscles  rhonchi  crackles    CARD Regular rate and rhythm  No murmur    ABD normal BS    LYMPH negative neck    EXTR positive edema    SKIN normal to palpation    NEURO cranial nerves intact, motor/sensory function intact    PSYCH A+O to time, place, person   Review of Systems:   General: Weakness    Skin: No Complaints    ENT: No Complaints    Eyes: No Complaints    Neck: No Complaints    Respiratory: Short of breath    Cardiovascular: Chest pain or discomfort  Orthopnea  Edema    Gastrointestinal: No Complaints    Genitourinary: No Complaints    Vascular: No Complaints    Musculoskeletal: No Complaints    Neurologic: No Complaints    Hematologic: No Complaints    Endocrine: No Complaints    Psychiatric: No Complaints    Review of Systems: All other systems were reviewed and found to be negative    Medications/Allergies Reviewed Medications/Allergies reviewed        Admit Diagnosis:   ELEVATED TROPONIN LEVEL: 27-Nov-2011, Active, ELEVATED TROPONIN LEVEL      Admit Reason:   Elevated troponin level: (790.6) 27-Nov-2011, Active, ICD9, Other abnormal blood chemistry  Home Medications: Medication Instructions Status  furosemide 40 mg oral tablet 1 tab(s) orally 2 times a day Active  Accupril 20 mg oral tablet 2 tab(s)  orally once a day Active  spironolactone 25 mg oral tablet 1 tab(s) orally 2 times a day Active  Klor-Con 8 oral tablet, extended release 2 tab(s) orally once a day Active  Norvasc 5 mg oral tablet 1 tab(s) orally once a day Active  aspirin 81 mg oral tablet 1 tab(s) orally once a day Active  carvedilol 25 mg oral tablet 1 tab(s) orally 2 times a day Active   EKG:   EKG NSR    No Known Allergies:     Impression pt with cardio,yopathy and hypertension admitted with accelerated hypertension, shortneess of breath and evedence of chf on cxr.  Mild troponin elevation.  Admitted to noncompliance with all of his meds and eats a high sodium diet.  Troponin elevation appears to be due to increased demand and hypertension.  Noncompliance with meds appears to be likely etiology.    Plan 1.  Resume home meds 2. Weight loss 3. Careful diuresis following renal function. 4. Daily weights 5. Dietary counceling 6. Unless pt develops evidence of ischemicall or by ekg, would not proceed with cardiac cath at this point. 7. Ambulate in am and if stable back on meds, consider discharge with follow [place] [facility]   Electronic Signatures: Dalia HeadingFath, Sharne Linders A (MD)  (Signed [phone]-Jul-13  20:59)  Authored: General Aspect/Present Illness, History and Physical Exam, Review of System, Health Issues, Home Medications, EKG , Allergies, Impression/Plan   Last Updated: 03-Jul-13 20:59 by Dalia Heading (MD)

## 2020-08-25 DEATH — deceased
